# Patient Record
Sex: Male | Born: 1960
Health system: Southern US, Community
[De-identification: ages and names within clinical notes are randomized; demographics above are authoritative.]

## PROBLEM LIST (undated history)

## (undated) DIAGNOSIS — N289 Disorder of kidney and ureter, unspecified: Secondary | ICD-10-CM

## (undated) DIAGNOSIS — C801 Malignant (primary) neoplasm, unspecified: Secondary | ICD-10-CM

## (undated) DIAGNOSIS — N200 Calculus of kidney: Secondary | ICD-10-CM

---

## 2010-06-23 ENCOUNTER — Emergency Department (HOSPITAL_BASED_OUTPATIENT_CLINIC_OR_DEPARTMENT_OTHER): Admission: EM | Admit: 2010-06-23 | Discharge: 2010-06-23 | Payer: Self-pay | Admitting: Emergency Medicine

## 2017-08-22 ENCOUNTER — Emergency Department (HOSPITAL_BASED_OUTPATIENT_CLINIC_OR_DEPARTMENT_OTHER): Payer: BLUE CROSS/BLUE SHIELD

## 2017-08-22 ENCOUNTER — Encounter (HOSPITAL_BASED_OUTPATIENT_CLINIC_OR_DEPARTMENT_OTHER): Payer: Self-pay | Admitting: *Deleted

## 2017-08-22 ENCOUNTER — Emergency Department (HOSPITAL_BASED_OUTPATIENT_CLINIC_OR_DEPARTMENT_OTHER)
Admission: EM | Admit: 2017-08-22 | Discharge: 2017-08-22 | Disposition: A | Discharge status: Home / Self Care | Payer: BLUE CROSS/BLUE SHIELD | Attending: Emergency Medicine | Admitting: Emergency Medicine

## 2017-08-22 DIAGNOSIS — R1031 Right lower quadrant pain: Secondary | ICD-10-CM | POA: Diagnosis present

## 2017-08-22 DIAGNOSIS — F17210 Nicotine dependence, cigarettes, uncomplicated: Secondary | ICD-10-CM | POA: Insufficient documentation

## 2017-08-22 DIAGNOSIS — N200 Calculus of kidney: Secondary | ICD-10-CM

## 2017-08-22 HISTORY — DX: Disorder of kidney and ureter, unspecified: N28.9

## 2017-08-22 HISTORY — DX: Malignant (primary) neoplasm, unspecified: C80.1

## 2017-08-22 LAB — URINALYSIS, ROUTINE W REFLEX MICROSCOPIC
Bilirubin Urine: NEGATIVE
GLUCOSE, UA: NEGATIVE mg/dL
Ketones, ur: NEGATIVE mg/dL
Leukocytes, UA: NEGATIVE
Nitrite: NEGATIVE
PH: 5.5 (ref 5.0–8.0)
Protein, ur: 30 mg/dL — AB

## 2017-08-22 LAB — URINALYSIS, MICROSCOPIC (REFLEX): WBC, UA: NONE SEEN WBC/hpf (ref 0–5)

## 2017-08-22 MED ORDER — OXYCODONE-ACETAMINOPHEN 5-325 MG PO TABS: 2.0000 | ORAL_TABLET | ORAL | 0 refills | Status: DC | PRN

## 2017-08-22 MED ORDER — KETOROLAC TROMETHAMINE 30 MG/ML IJ SOLN
30.0000 mg | Freq: Once | INTRAMUSCULAR | Status: AC
  Administered 2017-08-22: 30 mg via INTRAVENOUS

## 2017-08-22 MED ORDER — TAMSULOSIN HCL 0.4 MG PO CAPS: 0.4000 mg | ORAL_CAPSULE | Freq: Every day | ORAL | 0 refills | Status: AC

## 2017-08-22 MED ORDER — HYDROMORPHONE HCL 1 MG/ML IJ SOLN
1.0000 mg | Freq: Once | INTRAMUSCULAR | Status: AC
  Administered 2017-08-22: 1 mg via INTRAVENOUS
  Filled 2017-08-22: qty 1

## 2017-08-22 MED ORDER — KETOROLAC TROMETHAMINE 10 MG PO TABS: 10.0000 mg | ORAL_TABLET | Freq: Four times a day (QID) | ORAL | 0 refills | Status: DC | PRN

## 2017-08-22 MED ORDER — ONDANSETRON HCL 4 MG/2ML IJ SOLN
4.0000 mg | Freq: Once | INTRAMUSCULAR | Status: AC
  Administered 2017-08-22: 4 mg via INTRAVENOUS
  Filled 2017-08-22: qty 2

## 2017-08-22 MED ORDER — SODIUM CHLORIDE 0.9 % IV SOLN
Freq: Once | INTRAVENOUS | Status: AC
  Administered 2017-08-22: 07:00:00 via INTRAVENOUS

## 2017-08-22 MED ORDER — KETOROLAC TROMETHAMINE 30 MG/ML IJ SOLN
INTRAMUSCULAR | Status: AC
  Filled 2017-08-22: qty 1

## 2017-08-22 MED FILL — KETOROLAC 10 MG TABLET: 10 | 5 days supply | Qty: 20 | Fill #0

## 2017-08-22 MED FILL — TAMSULOSIN HCL 0.4 MG CAP: 0.4 | 30 days supply | Qty: 30 | Fill #0

## 2017-08-22 MED FILL — OXYCODONE-ACETAMINOPHEN 5-3: 5-325 | 2 days supply | Qty: 15 | Fill #0

## 2017-08-22 NOTE — ED Triage Notes (Signed)
Pt with  Right flank pain since 4 am

## 2017-08-22 NOTE — Discharge Instructions (Signed)
You have been seen in the Emergency Department (ED) today for pain that we believe based on your workup, is caused by kidney stones.  As we have discussed, please drink plenty of fluids.  Please make a follow up appointment with the physician(s) listed elsewhere in this documentation. ° °You may take pain medication as needed but ONLY as prescribed.  Please also take your prescribed Flomax daily.  We also recommend that you take over-the-counter ibuprofen regularly according to label instructions over the next 5 days.  Take it with meals to minimize stomach discomfort. ° °Please see your doctor as soon as possible as stones may take 1-3 weeks to pass and you may require additional care or medications. ° °Do not drink alcohol, drive or participate in any other potentially dangerous activities while taking opiate pain medication as it may make you sleepy. Do not take this medication with any other sedating medications, either prescription or over-the-counter. If you were prescribed Percocet or Vicodin, do not take these with acetaminophen (Tylenol) as it is already contained within these medications. °  °Take Percocet as needed for severe pain.  This medication is an opiate (or narcotic) pain medication and can be habit forming.  Use it as little as possible to achieve adequate pain control.  Do not use or use it with extreme caution if you have a history of opiate abuse or dependence.  If you are on a pain contract with your primary care doctor or a pain specialist, be sure to let them know you were prescribed this medication today from the Emergency Department.  This medication is intended for your use only - do not give any to anyone else and keep it in a secure place where nobody else, especially children, have access to it.  It will also cause or worsen constipation, so you may want to consider taking an over-the-counter stool softener while you are taking this medication. ° °Return to the Emergency Department  (ED) or call your doctor if you have any worsening pain, fever, painful urination, are unable to urinate, or develop other symptoms that concern you. ° ° ° °Kidney Stones °Kidney stones (urolithiasis) are deposits that form inside your kidneys. The intense pain is caused by the stone moving through the urinary tract. When the stone moves, the ureter goes into spasm around the stone. The stone is usually passed in the urine.  °CAUSES  °A disorder that makes certain neck glands produce too much parathyroid hormone (primary hyperparathyroidism). °A buildup of uric acid crystals, similar to gout in your joints. °Narrowing (stricture) of the ureter. °A kidney obstruction present at birth (congenital obstruction). °Previous surgery on the kidney or ureters. °Numerous kidney infections. °SYMPTOMS  °Feeling sick to your stomach (nauseous). °Throwing up (vomiting). °Blood in the urine (hematuria). °Pain that usually spreads (radiates) to the groin. °Frequency or urgency of urination. °DIAGNOSIS  °Taking a history and physical exam. °Blood or urine tests. °CT scan. °Occasionally, an examination of the inside of the urinary bladder (cystoscopy) is performed. °TREATMENT  °Observation. °Increasing your fluid intake. °Extracorporeal shock wave lithotripsy--This is a noninvasive procedure that uses shock waves to break up kidney stones. °Surgery may be needed if you have severe pain or persistent obstruction. There are various surgical procedures. Most of the procedures are performed with the use of small instruments. Only small incisions are needed to accommodate these instruments, so recovery time is minimized. °The size, location, and chemical composition are all important variables that will determine the proper   choice of action for you. Talk to your health care provider to better understand your situation so that you will minimize the risk of injury to yourself and your kidney.  °HOME CARE INSTRUCTIONS  °Drink enough water  and fluids to keep your urine clear or pale yellow. This will help you to pass the stone or stone fragments. °Strain all urine through the provided strainer. Keep all particulate matter and stones for your health care provider to see. The stone causing the pain may be as small as a grain of salt. It is very important to use the strainer each and every time you pass your urine. The collection of your stone will allow your health care provider to analyze it and verify that a stone has actually passed. The stone analysis will often identify what you can do to reduce the incidence of recurrences. °Only take over-the-counter or prescription medicines for pain, discomfort, or fever as directed by your health care provider. °Keep all follow-up visits as told by your health care provider. This is important. °Get follow-up X-rays if required. The absence of pain does not always mean that the stone has passed. It may have only stopped moving. If the urine remains completely obstructed, it can cause loss of kidney function or even complete destruction of the kidney. It is your responsibility to make sure X-rays and follow-ups are completed. Ultrasounds of the kidney can show blockages and the status of the kidney. Ultrasounds are not associated with any radiation and can be performed easily in a matter of minutes. °Make changes to your daily diet as told by your health care provider. You may be told to: °Limit the amount of salt that you eat. °Eat 5 or more servings of fruits and vegetables each day. °Limit the amount of meat, poultry, fish, and eggs that you eat. °Collect a 24-hour urine sample as told by your health care provider. You may need to collect another urine sample every 6-12 months. °SEEK MEDICAL CARE IF: °You experience pain that is progressive and unresponsive to any pain medicine you have been prescribed. °SEEK IMMEDIATE MEDICAL CARE IF:  °Pain cannot be controlled with the prescribed medicine. °You have a  fever or shaking chills. °The severity or intensity of pain increases over 18 hours and is not relieved by pain medicine. °You develop a new onset of abdominal pain. °You feel faint or pass out. °You are unable to urinate. °  °This information is not intended to replace advice given to you by your health care provider. Make sure you discuss any questions you have with your health care provider. °  °Document Released: 10/08/2005 Document Revised: 06/29/2015 Document Reviewed: 03/11/2013 °Elsevier Interactive Patient Education ©2016 [employer] ° ° °

## 2017-08-22 NOTE — ED Provider Notes (Signed)
[facility] EMERGENCY DEPARTMENT Provider Note   CSN: [id number] Arrival date & time: 08/22/17  6063  History   Chief Complaint Chief Complaint  Patient presents with  . Flank Pain   HPI  Ryan Chaney is a 56 y/o male with h/o kidney stone and basal cell carcinoma on scalp (removed last year).  Patient had R flank pain which began around 4:30 AM.  Pain has been 10/10 in severity and constant in nature.  Localized to R side of abdomen and radiating to the R flank/back area.  Able to urinate and did so about an hour ago.  Did not have burning only discomfort with urination.  Has not had increased frequency or urgency.  Last episode of kidney stones was 20 years ago and he reports it was not nearly as painful.  At that time the pain was intermittent.  He felt nauseous en route to ED but has not vomited.  Currently denies nausea.  He denies fevers, chills, shortness of breath, chest pain, palpitations.  Denies hematuria.  Reports that he has had a little cold this past week which is resolving.  Does not take any medications.   Past Medical History:  Diagnosis Date  . Cancer ([place])    skin cancer   . Renal disorder    kidney stones   There are no active problems to display for this patient.  History reviewed. No pertinent surgical history.  Home Medications    Prior to Admission medications   Medication Sig Start Date End Date Taking? Authorizing Provider  ketorolac (TORADOL) 10 MG tablet Take 1 tablet (10 mg total) by mouth every 6 (six) hours as needed for moderate pain. 08/22/17   Long, Wonda Olds, MD  oxyCODONE-acetaminophen (PERCOCET/ROXICET) 5-325 MG tablet Take 2 tablets by mouth every 4 (four) hours as needed for severe pain. 08/22/17   Long, Wonda Olds, MD  tamsulosin (FLOMAX) 0.4 MG CAPS capsule Take 1 capsule (0.4 mg total) by mouth daily. 08/22/17 09/05/17  Long, Wonda Olds, MD   Family History No family history on file.  Social History Social History  Substance  Use Topics  . Smoking status: Current Every Day Smoker  . Smokeless tobacco: Never Used  . Alcohol use No   Allergies   Patient has no known allergies.  Review of Systems Review of Systems  Constitutional: Negative for chills, diaphoresis and fever.  HENT: Negative for congestion and sore throat.   Respiratory: Negative for shortness of breath and wheezing.   Cardiovascular: Negative for chest pain, palpitations and leg swelling.  Gastrointestinal: Positive for abdominal pain. Negative for nausea and vomiting.  Genitourinary: Positive for flank pain. Negative for difficulty urinating, dysuria and hematuria.   Physical Exam Updated Vital Signs BP (!) 169/85 (BP Location: Left Arm)   Pulse 89   Temp 98.1 F (36.7 C) (Oral)   Resp 18   Ht 6' (1.829 m)   Wt 99.8 kg (220 lb)   SpO2 100%   BMI 29.84 kg/m   Physical Exam Gen- 56 yo male, in moderate distress, wife at bedside  Skin - no rashes noted  HEENT - EOMI, PERRL, MMM, o/p clear  Neck - supple, no JVD Chest - clear to auscultation bilaterally, no wheeze  Heart - RRR no MRG  Abdomen - soft, TTP over RUQ, no rebound tenderness  Musculoskeletal - no edema or tenderness Neuro - AOx3, no focal deficits, speech is normal   ED Treatments / Results  Labs (all labs ordered  are listed, but only abnormal results are displayed) Labs Reviewed  URINALYSIS, ROUTINE W REFLEX MICROSCOPIC - Abnormal; Notable for the following:       Result Value   Specific Gravity, Urine >1.030 (*)    Hgb urine dipstick LARGE (*)    Protein, ur 30 (*)    All other components within normal limits  URINALYSIS, MICROSCOPIC (REFLEX) - Abnormal; Notable for the following:    Bacteria, UA RARE (*)    Squamous Epithelial / LPF 0-5 (*)    All other components within normal limits   EKG  EKG Interpretation None      Radiology Ct Renal Stone Study  Result Date: 08/22/2017 CLINICAL DATA:  Severe right flank pain for 3 hours. EXAM: CT ABDOMEN AND  PELVIS WITHOUT CONTRAST TECHNIQUE: Multidetector CT imaging of the abdomen and pelvis was performed following the standard protocol without IV contrast. COMPARISON:  None. FINDINGS: Lower chest: The lung bases are clear of acute process. No pleural effusion or pulmonary lesions. The heart is normal in size. No pericardial effusion. Scattered coronary artery calcifications are noted. The distal esophagus and aorta are unremarkable. Hepatobiliary: A few tiny scattered low-attenuation lesions are likely benign hepatic cysts. No worrisome hepatic findings or intrahepatic biliary dilatation. The gallbladder appears normal. No common bile duct dilatation. Pancreas: No mass, inflammation or ductal dilatation. Spleen: Normal size.  No focal lesions. Adrenals/Urinary Tract: The adrenal glands are normal except for a benign fatty lesion involving the lateral limb of the left adrenal gland consistent with a adrenal gland myelolipoma. Midpole right renal calculi are noted. There is also moderate right-sided hydroureteronephrosis down to an obstructing The left kidney is normal. No renal, ureteral or bladder calculi. No worrisome renal or bladder lesions without contrast. Stomach/Bowel: The stomach, duodenum, small bowel and colon are unremarkable. No acute inflammatory changes, mass lesions or obstructive findings. The terminal ileum is normal. The appendix is normal. Mild to moderate sigmoid diverticulosis without findings for acute diverticulitis. Vascular/Lymphatic: The age advanced atherosclerotic calcifications involving the aorta and iliac arteries. No aneurysm. No mesenteric or retroperitoneal mass or lymphadenopathy. Small scattered lymph nodes are noted. Reproductive: The prostate gland and seminal vesicles are unremarkable. Other: Prominent inguinal rings containing fat. No inguinal mass or adenopathy. No free pelvic fluid collections. Small periumbilical abdominal wall hernia containing fat. Musculoskeletal: No  significant bony findings. Moderate degenerative changes involving the lumbar spine and hips. IMPRESSION: 1. 2 mm right UVJ calculus causing moderate right-sided hydroureteronephrosis. 2. Tube midpole right renal calculi. 3. 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 4. A few scattered low-attenuation liver lesions are most likely benign cysts. 5. Age advanced atherosclerotic calcifications involving the aorta, iliac arteries and coronary arteries. Electronically Signed   By: Marijo Sanes M.D.   On: 08/22/2017 07:14   Procedures Procedures (including critical care time)  Medications Ordered in ED Medications  0.9 %  sodium chloride infusion ( Intravenous Stopped 08/22/17 0815)  ondansetron (ZOFRAN) injection 4 mg (4 mg Intravenous Given 08/22/17 0643)  HYDROmorphone (DILAUDID) injection 1 mg (1 mg Intravenous Given 08/22/17 0643)  ketorolac (TORADOL) 30 MG/ML injection 30 mg (30 mg Intravenous Given 08/22/17 0734)   Initial Impression / Assessment and Plan / ED Course  I have reviewed the triage vital signs and the nursing notes.  Pertinent labs & imaging results that were available during my care of the patient were reviewed by me and considered in my medical decision making (see chart for details).  56 yo male with distant  hx of kidney stones 20 years ago presenting with R flank pain beginning this morning. UA with large hgb but otherwise noninfectious. IV Dilaudid x1 given for pain control and Zofran for nausea.  CT revealed 2 mm calculus in right UVJ causing moderate-sized hydroureteronephrosis. Anticipate stone is small enough to pass on it's own.  -Gentle hydration with NS @ 125 cc.hr.   -IV Toradol for ongoing discomfort  -Plan for follow up with Urology for further eval once pain well controlled -Discharge home with Rx for Flomax, Toradol and percocet.      Final Clinical Impressions(s) / ED Diagnoses   Final diagnoses:  Kidney stone   New Prescriptions Discharge Medication List  as of 08/22/2017  8:11 AM    START taking these medications   Details  ketorolac (TORADOL) 10 MG tablet Take 1 tablet (10 mg total) by mouth every 6 (six) hours as needed for moderate pain., Starting Thu 08/22/2017, Print    oxyCODONE-acetaminophen (PERCOCET/ROXICET) 5-325 MG tablet Take 2 tablets by mouth every 4 (four) hours as needed for severe pain., Starting Thu 08/22/2017, Print    tamsulosin (FLOMAX) 0.4 MG CAPS capsule Take 1 capsule (0.4 mg total) by mouth daily., Starting Thu 08/22/2017, Until Thu 09/05/2017, Print        Lovenia Kim, MD  [place], PGY-2      Lovenia Kim, MD 08/22/17 [id number]    Margette Fast, MD 08/22/17 [phone]

## 2018-03-18 IMAGING — CT CT RENAL STONE PROTOCOL
2 of 4 series · 16 of 46 positions shown, 18 images · non-contrast
Comparison: None.

CLINICAL DATA: Severe right flank pain for 3 hours.

EXAM:
CT ABDOMEN AND PELVIS WITHOUT CONTRAST
TECHNIQUE: Multidetector CT imaging of the abdomen and pelvis was performed
following the standard protocol without IV contrast.

[Series 2: axial st · axial · 0.88mm/px · z∈[-514,-69]mm · 13 of 99 slices shown, 15 images]
[im 5/99  soft-tissue]
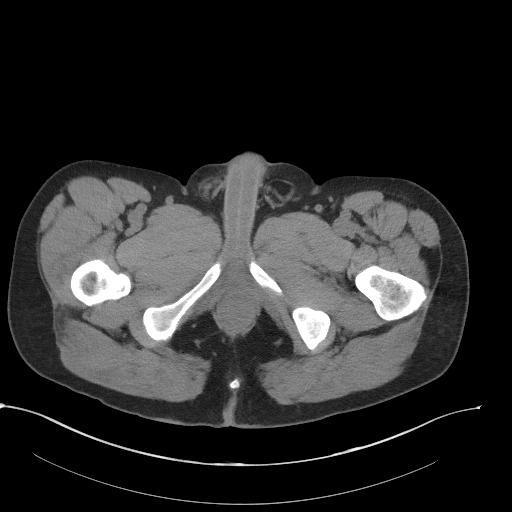
[im 5/99  bone]
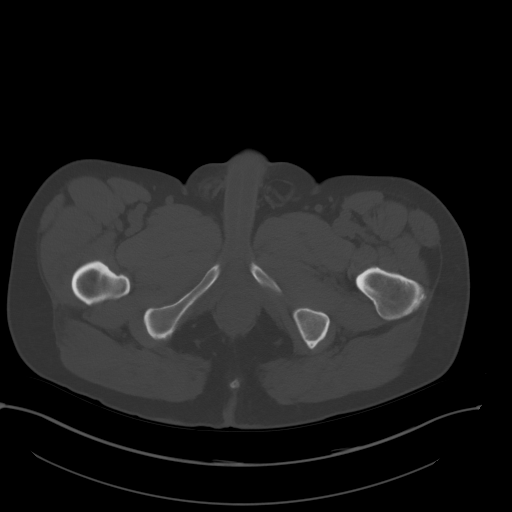
[im 13/99  soft-tissue]
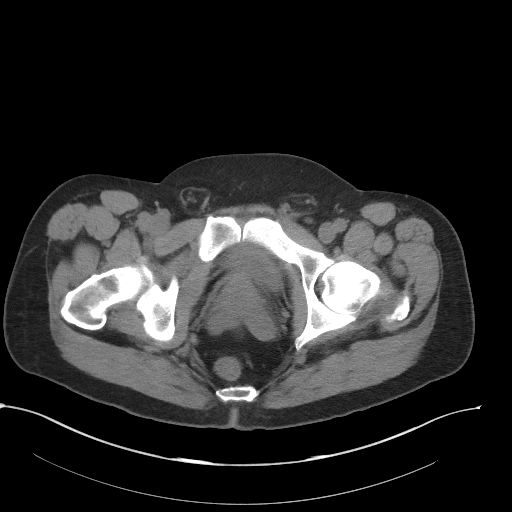
[im 22/99  soft-tissue]
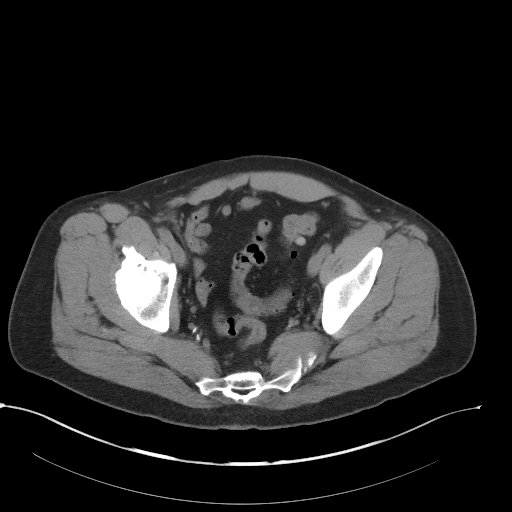
[im 26/99  soft-tissue]
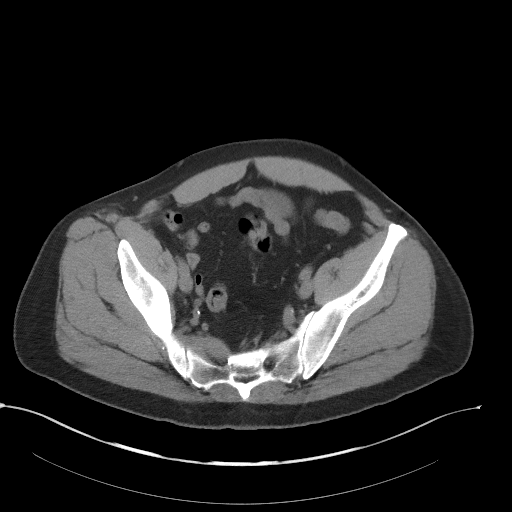
[im 35/99  soft-tissue]
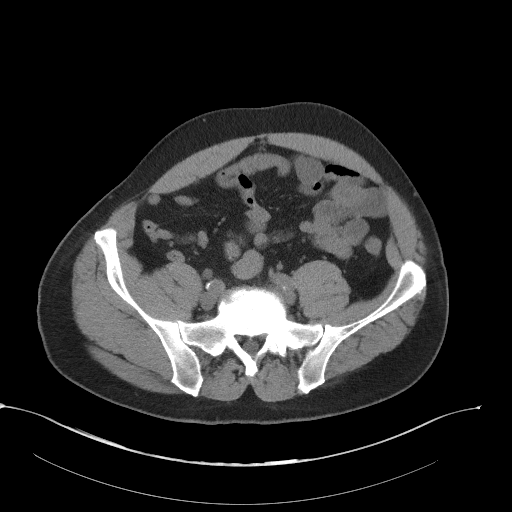
[im 43/99  soft-tissue]
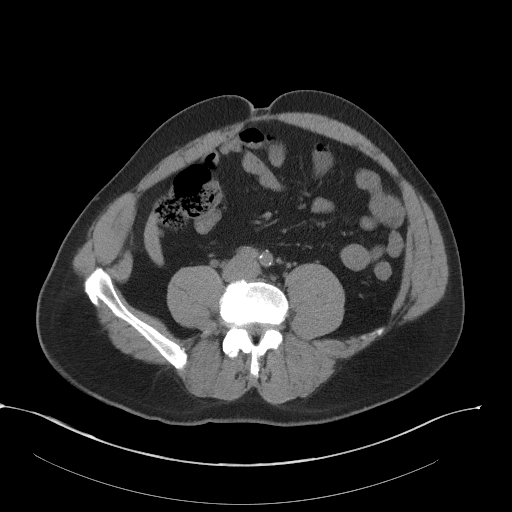
[im 52/99  soft-tissue]
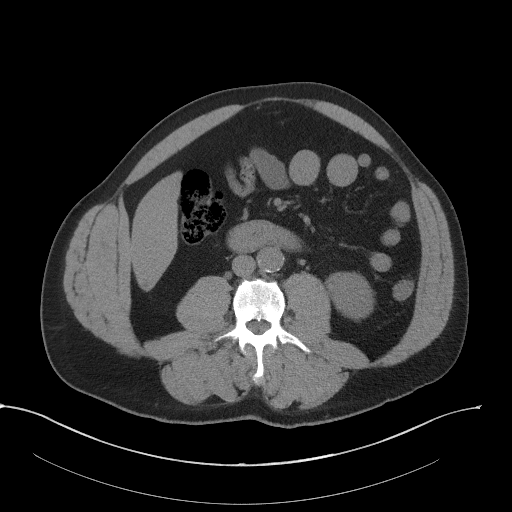
[im 56/99  soft-tissue]
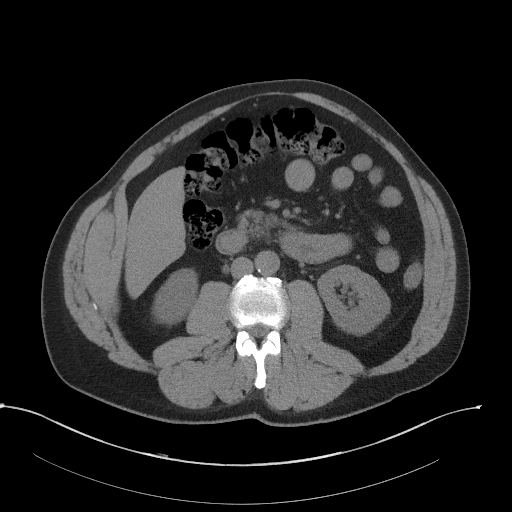
[im 64/99  soft-tissue]
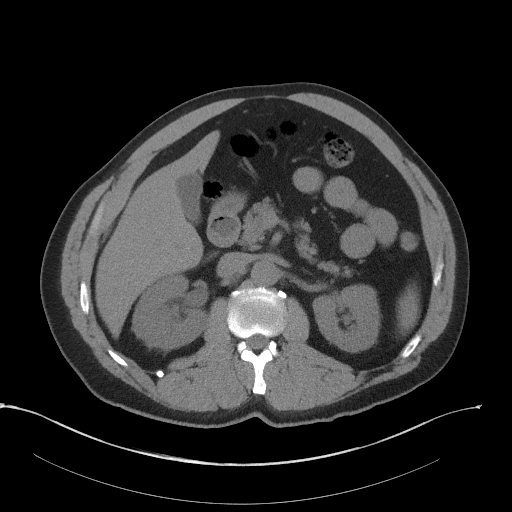
[im 64/99  bone]
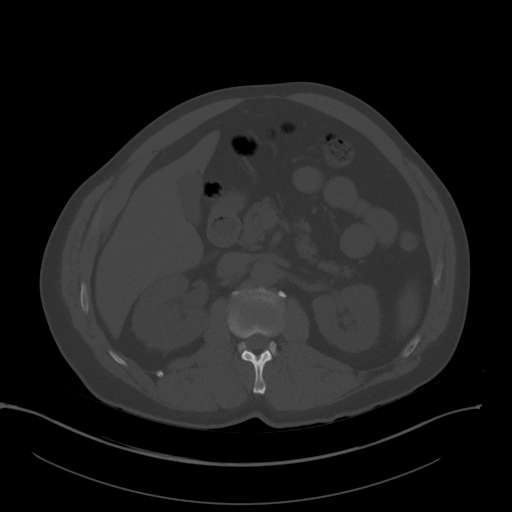
[im 73/99  soft-tissue]
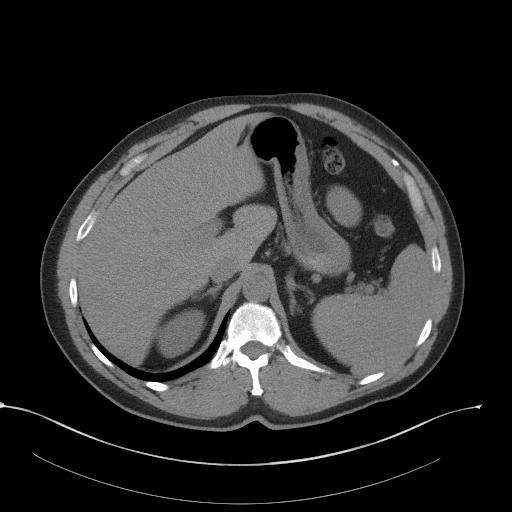
[im 77/99  soft-tissue]
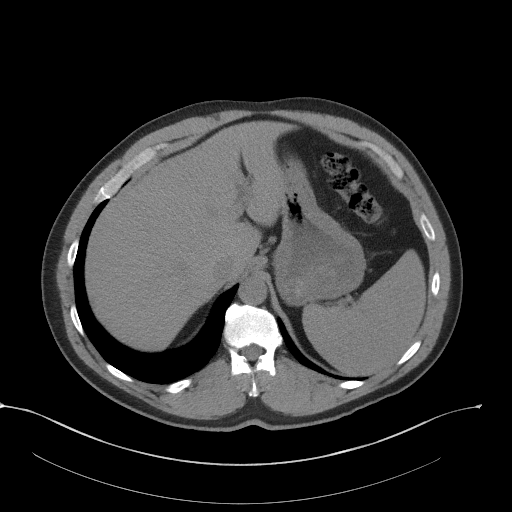
[im 86/99  soft-tissue]
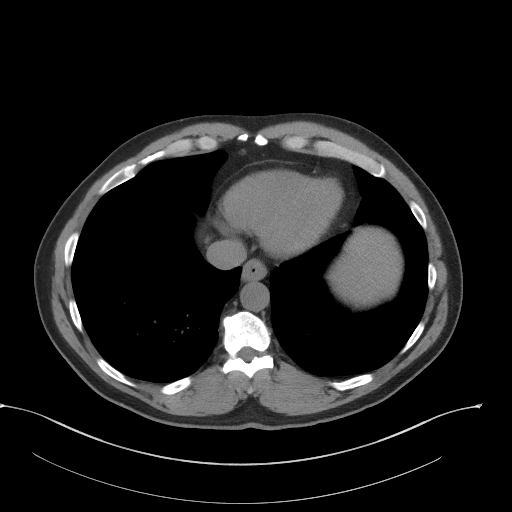
[im 94/99  soft-tissue]
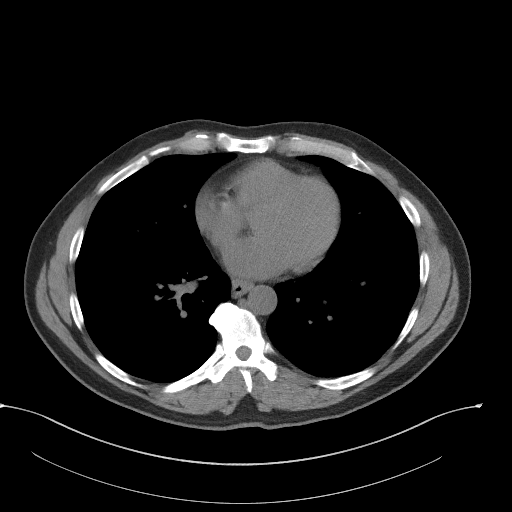

[Series 4: coronal st · coronal · 0.89mm/px · 3 of 99 slices shown]
[im 33/99  soft-tissue]
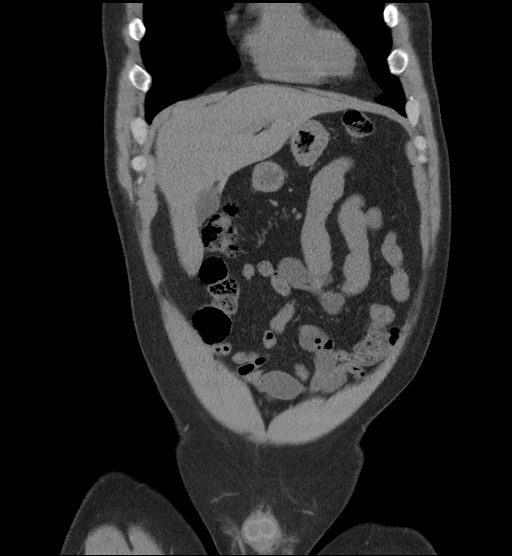
[im 44/99  soft-tissue]
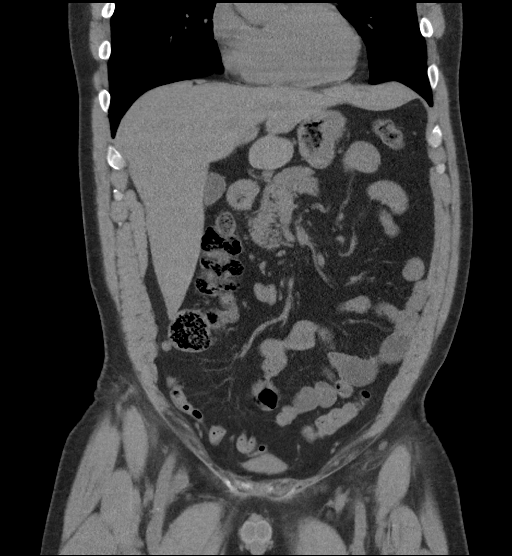
[im 55/99  soft-tissue]
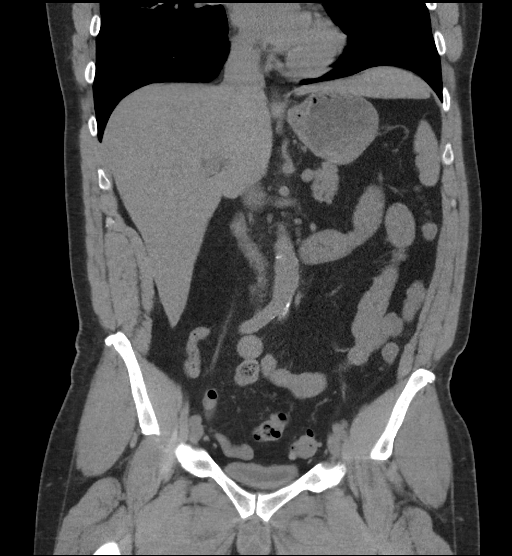

[16 of 46 positions shown; findings below may reference images not displayed]

FINDINGS: Lower chest: The lung bases are clear of acute process. No pleural
effusion or pulmonary lesions. The heart is normal in size. No
pericardial effusion. Scattered coronary artery calcifications are
noted. The distal esophagus and aorta are unremarkable.

Hepatobiliary: A few tiny scattered low-attenuation lesions are
likely benign hepatic cysts. No worrisome hepatic findings or
intrahepatic biliary dilatation. The gallbladder appears normal. No
common bile duct dilatation.

Pancreas: No mass, inflammation or ductal dilatation.

Spleen: Normal size.  No focal lesions.

Adrenals/Urinary Tract: The adrenal glands are normal except for a
benign fatty lesion involving the lateral limb of the left adrenal
gland consistent with a adrenal gland myelolipoma.

Midpole right renal calculi are noted. There is also moderate
right-sided hydroureteronephrosis down to an obstructing

The left kidney is normal. No renal, ureteral or bladder calculi. No
worrisome renal or bladder lesions without contrast.

Stomach/Bowel: The stomach, duodenum, small bowel and colon are
unremarkable. No acute inflammatory changes, mass lesions or
obstructive findings. The terminal ileum is normal. The appendix is
normal. Mild to moderate sigmoid diverticulosis without findings for
acute diverticulitis.

Vascular/Lymphatic: The age advanced atherosclerotic calcifications
involving the aorta and iliac arteries. No aneurysm. No mesenteric
or retroperitoneal mass or lymphadenopathy. Small scattered lymph
nodes are noted.

Reproductive: The prostate gland and seminal vesicles are
unremarkable.

Other: Prominent inguinal rings containing fat. No inguinal mass or
adenopathy. No free pelvic fluid collections. Small periumbilical
abdominal wall hernia containing fat.

Musculoskeletal: No significant bony findings. Moderate degenerative
changes involving the lumbar spine and hips.
IMPRESSION: 1. 2 mm right UVJ calculus causing moderate right-sided
hydroureteronephrosis.
2. Tube midpole right renal calculi.
3. Benign 2.3 cm left adrenal gland myelolipoma.
4. A few scattered low-attenuation liver lesions are most likely
benign cysts.
5. Age advanced atherosclerotic calcifications involving the aorta,
iliac arteries and coronary arteries.

## 2018-05-29 ENCOUNTER — Other Ambulatory Visit: Payer: Self-pay

## 2018-05-29 ENCOUNTER — Encounter (HOSPITAL_BASED_OUTPATIENT_CLINIC_OR_DEPARTMENT_OTHER): Payer: Self-pay

## 2018-05-29 ENCOUNTER — Emergency Department (HOSPITAL_BASED_OUTPATIENT_CLINIC_OR_DEPARTMENT_OTHER)
Admission: EM | Admit: 2018-05-29 | Discharge: 2018-05-30 | Disposition: A | Discharge status: Home / Self Care | Payer: BLUE CROSS/BLUE SHIELD | Attending: Emergency Medicine | Admitting: Emergency Medicine

## 2018-05-29 ENCOUNTER — Emergency Department (HOSPITAL_BASED_OUTPATIENT_CLINIC_OR_DEPARTMENT_OTHER): Payer: BLUE CROSS/BLUE SHIELD

## 2018-05-29 DIAGNOSIS — N50811 Right testicular pain: Secondary | ICD-10-CM | POA: Diagnosis not present

## 2018-05-29 DIAGNOSIS — F17200 Nicotine dependence, unspecified, uncomplicated: Secondary | ICD-10-CM | POA: Insufficient documentation

## 2018-05-29 DIAGNOSIS — Z85828 Personal history of other malignant neoplasm of skin: Secondary | ICD-10-CM | POA: Diagnosis not present

## 2018-05-29 DIAGNOSIS — Z87442 Personal history of urinary calculi: Secondary | ICD-10-CM | POA: Diagnosis not present

## 2018-05-29 DIAGNOSIS — N23 Unspecified renal colic: Secondary | ICD-10-CM

## 2018-05-29 DIAGNOSIS — R109 Unspecified abdominal pain: Secondary | ICD-10-CM | POA: Diagnosis present

## 2018-05-29 HISTORY — DX: Calculus of kidney: N20.0

## 2018-05-29 MED ORDER — ONDANSETRON HCL 4 MG/2ML IJ SOLN
4.0000 mg | Freq: Once | INTRAMUSCULAR | Status: AC
  Administered 2018-05-29: 4 mg via INTRAVENOUS
  Filled 2018-05-29: qty 2

## 2018-05-29 MED ORDER — HYDROMORPHONE HCL 1 MG/ML IJ SOLN
1.0000 mg | Freq: Once | INTRAMUSCULAR | Status: AC
  Administered 2018-05-29: 1 mg via INTRAVENOUS
  Filled 2018-05-29: qty 1

## 2018-05-29 NOTE — ED Triage Notes (Signed)
Pt reports 10/10 right flank pain w/ radiation to testicles. Pt reports hx of kidney stones. Pt A+OX4, NAD.

## 2018-05-29 NOTE — ED Provider Notes (Signed)
[facility] EMERGENCY DEPARTMENT Provider Note   CSN: [id number] Arrival date & time: 05/29/18  2335     History   Chief Complaint Chief Complaint  Patient presents with  . Flank Pain    Right  . Testicle Pain    HPI Ryan Chaney is a 57 y.o. male.  Patient presents to the emergency department for evaluation of flank pain, testicular pain.  Patient reports that 4 days ago he started having pain in the area of his penis.  There was some right sided abdominal discomfort at that time as well.  Today, however, he had sudden onset of severe right flank pain and right testicular pain.  He reports that he has a history of kidney stones.  The flank pain feels exactly like previous kidney stones, however, he has never had testicular pain with his kidney stones.  He reports that his testicular pain feels like "blue balls".     Past Medical History:  Diagnosis Date  . Cancer ([place])    skin cancer   . Kidney stone   . Renal disorder    kidney stones    There are no active problems to display for this patient.   History reviewed. No pertinent surgical history.      Home Medications    Prior to Admission medications   Medication Sig Start Date End Date Taking? Authorizing Provider  oxyCODONE-acetaminophen (PERCOCET) 5-325 MG tablet Take 1-2 tablets by mouth every 4 (four) hours as needed. 05/30/18   Orpah Greek, MD  tamsulosin (FLOMAX) 0.4 MG CAPS capsule Take 1 capsule (0.4 mg total) by mouth daily. 05/30/18   Orpah Greek, MD    Family History History reviewed. No pertinent family history.  Social History Social History   Tobacco Use  . Smoking status: Current Every Day Smoker  . Smokeless tobacco: Never Used  Substance Use Topics  . Alcohol use: No  . Drug use: No     Allergies   Patient has no known allergies.   Review of Systems Review of Systems  Genitourinary: Positive for flank pain, penile pain and testicular pain.    All other systems reviewed and are negative.    Physical Exam Updated Vital Signs BP (!) 138/94 (BP Location: Right Arm)   Pulse 94   Temp (!) 97.5 F (36.4 C) (Oral)   Resp 16   SpO2 97%   Physical Exam  Constitutional: He is oriented to person, place, and time. He appears well-developed and well-nourished. He appears distressed.  HENT:  Head: Normocephalic and atraumatic.  Right Ear: Hearing normal.  Left Ear: Hearing normal.  Nose: Nose normal.  Mouth/Throat: Oropharynx is clear and moist and mucous membranes are normal.  Eyes: Pupils are equal, round, and reactive to light. Conjunctivae and EOM are normal.  Neck: Normal range of motion. Neck supple.  Cardiovascular: Regular rhythm, S1 normal and S2 normal. Exam reveals no gallop and no friction rub.  No murmur heard. Pulmonary/Chest: Effort normal and breath sounds normal. No respiratory distress. He exhibits no tenderness.  Abdominal: Soft. Normal appearance and bowel sounds are normal. There is no hepatosplenomegaly. There is no tenderness. There is no rebound, no guarding, no tenderness at McBurney's point and negative Murphy's sign. No hernia.  Genitourinary: Penis normal. Right testis shows no mass and no swelling. Left testis shows no mass and no swelling.  Musculoskeletal: Normal range of motion.  Neurological: He is alert and oriented to person, place, and time. He has normal  strength. No cranial nerve deficit or sensory deficit. Coordination normal. GCS eye subscore is 4. GCS verbal subscore is 5. GCS motor subscore is 6.  Skin: Skin is warm, dry and intact. No rash noted. No cyanosis.  Psychiatric: He has a normal mood and affect. His speech is normal and behavior is normal. Thought content normal.  Nursing note and vitals reviewed.    ED Treatments / Results  Labs (all labs ordered are listed, but only abnormal results are displayed) Labs Reviewed  CBC WITH DIFFERENTIAL/PLATELET - Abnormal; Notable for the  following components:      Result Value   Platelets 134 (*)    All other components within normal limits  BASIC METABOLIC PANEL - Abnormal; Notable for the following components:   Glucose, Bld 114 (*)    All other components within normal limits  URINALYSIS, ROUTINE W REFLEX MICROSCOPIC - Abnormal; Notable for the following components:   APPearance CLOUDY (*)    Specific Gravity, Urine >1.030 (*)    Hgb urine dipstick LARGE (*)    Protein, ur 100 (*)    All other components within normal limits  URINALYSIS, MICROSCOPIC (REFLEX) - Abnormal; Notable for the following components:   Bacteria, UA RARE (*)    All other components within normal limits    EKG None  Radiology Ct Renal Stone Study  Result Date: 05/30/2018 CLINICAL DATA:  Flank pain radiating to the testicles. History of kidney stones. EXAM: CT ABDOMEN AND PELVIS WITHOUT CONTRAST TECHNIQUE: Multidetector CT imaging of the abdomen and pelvis was performed following the standard protocol without IV contrast. COMPARISON:  08/22/2017 CT abdomen pelvis FINDINGS: LOWER CHEST: No basilar pulmonary nodules or pleural effusion. No apical pericardial effusion. HEPATOBILIARY: Normal hepatic contours and density. No intra- or extrahepatic biliary dilatation. Normal gallbladder. PANCREAS: Normal parenchymal contours without ductal dilatation. No peripancreatic fluid collection. SPLEEN: Normal. ADRENALS/URINARY TRACT: --Adrenal glands: Normal. --Right kidney/ureter: Mild hydroureteronephrosis secondary to a 4 mm stone at the right ureterovesical junction. Minimal perinephric stranding. There are multiple nonobstructing renal calculi measuring up to 3 mm. --Left kidney/ureter: No hydronephrosis, nephroureterolithiasis, perinephric stranding or solid renal mass. --Urinary bladder: Normal for degree of distention STOMACH/BOWEL: --Stomach/Duodenum: No hiatal hernia or other gastric abnormality. Normal duodenal course. --Small bowel: No dilatation or  inflammation. --Colon: No focal abnormality. --Appendix: Normal. VASCULAR/LYMPHATIC: Atherosclerotic calcification is present within the non-aneurysmal abdominal aorta, without hemodynamically significant stenosis. No abdominal or pelvic lymphadenopathy. REPRODUCTIVE: Prostate calcification. MUSCULOSKELETAL. No bony spinal canal stenosis or focal osseous abnormality. OTHER: None. IMPRESSION: 1. Right obstructive uropathy secondary to 4 mm stone at the right ureterovesical junction, causing mild hydroureteronephrosis. 2. Multiple nonobstructing right renal calculi measuring up to 3 mm. 3.  Aortic Atherosclerosis (ICD10-I70.0). Electronically Signed   By: Ulyses Jarred M.D.   On: 05/30/2018 00:51    Procedures Procedures (including critical care time)  Medications Ordered in ED Medications  HYDROmorphone (DILAUDID) injection 1 mg (1 mg Intravenous Given 05/29/18 2352)  ondansetron (ZOFRAN) injection 4 mg (4 mg Intravenous Given 05/29/18 2351)  ketorolac (TORADOL) 30 MG/ML injection 30 mg (30 mg Intravenous Given 05/30/18 0038)     Initial Impression / Assessment and Plan / ED Course  I have reviewed the triage vital signs and the nursing notes.  Pertinent labs & imaging results that were available during my care of the patient were reviewed by me and considered in my medical decision making (see chart for details).     She presents to the ER for evaluation of  right flank pain, right testicular pain, penile pain.  Symptoms ongoing for 4 days.  He does have a history of kidney stones.  Urinalysis shows large blood, no obvious infection.  Patient has had resolution of pain with a dose of Dilaudid and Toradol.  CT renal stone study confirms 4 mm stone at the UVJ.  Patient will be discharged with analgesia, Flomax, follow-up with urology.  Final Clinical Impressions(s) / ED Diagnoses   Final diagnoses:  Ureteral colic    ED Discharge Orders         Ordered    oxyCODONE-acetaminophen (PERCOCET)  5-325 MG tablet  Every 4 hours PRN     05/30/18 0112    tamsulosin (FLOMAX) 0.4 MG CAPS capsule  Daily     05/30/18 0112           Orpah Greek, MD 05/30/18 [phone]

## 2018-05-30 LAB — URINALYSIS, ROUTINE W REFLEX MICROSCOPIC
BILIRUBIN URINE: NEGATIVE
Glucose, UA: NEGATIVE mg/dL
KETONES UR: NEGATIVE mg/dL
Leukocytes, UA: NEGATIVE
NITRITE: NEGATIVE
PROTEIN: 100 mg/dL — AB
pH: 5.5 (ref 5.0–8.0)

## 2018-05-30 LAB — BASIC METABOLIC PANEL
Anion gap: 11 (ref 5–15)
BUN: 18 mg/dL (ref 6–20)
CALCIUM: 9.2 mg/dL (ref 8.9–10.3)
CHLORIDE: 102 mmol/L (ref 98–111)
CO2: 24 mmol/L (ref 22–32)
CREATININE: 0.96 mg/dL (ref 0.61–1.24)
GFR calc non Af Amer: >60 mL/min (ref >60)
Glucose, Bld: 114 mg/dL — ABNORMAL HIGH (ref 70–99)
Potassium: 3.7 mmol/L (ref 3.5–5.1)
Sodium: 137 mmol/L (ref 135–145)

## 2018-05-30 LAB — URINALYSIS, MICROSCOPIC (REFLEX)

## 2018-05-30 LAB — CBC WITH DIFFERENTIAL/PLATELET
BASOS ABS: 0 10*3/uL (ref 0.0–0.1)
Basophils Relative: 0 %
EOS ABS: 0.2 10*3/uL (ref 0.0–0.7)
Eosinophils Relative: 3 %
HEMATOCRIT: 39.9 % (ref 39.0–52.0)
HEMOGLOBIN: 14.3 g/dL (ref 13.0–17.0)
Lymphocytes Relative: 25 %
Lymphs Abs: 2 10*3/uL (ref 0.7–4.0)
MCH: 32.3 pg (ref 26.0–34.0)
MCHC: 35.8 g/dL (ref 30.0–36.0)
MCV: 90.1 fL (ref 78.0–100.0)
Monocytes Absolute: 0.6 10*3/uL (ref 0.1–1.0)
Monocytes Relative: 8 %
NEUTROS ABS: 5 10*3/uL (ref 1.7–7.7)
NEUTROS PCT: 64 %
Platelets: 134 10*3/uL — ABNORMAL LOW (ref 150–400)
RBC: 4.43 MIL/uL (ref 4.22–5.81)
RDW: 12.8 % (ref 11.5–15.5)
WBC: 7.9 10*3/uL (ref 4.0–10.5)

## 2018-05-30 MED ORDER — TAMSULOSIN HCL 0.4 MG PO CAPS: 0.4000 mg | ORAL_CAPSULE | Freq: Every day | ORAL | 0 refills | Status: AC

## 2018-05-30 MED ORDER — KETOROLAC TROMETHAMINE 30 MG/ML IJ SOLN
30.0000 mg | Freq: Once | INTRAMUSCULAR | Status: AC
  Administered 2018-05-30: 30 mg via INTRAVENOUS
  Filled 2018-05-30: qty 1

## 2018-05-30 MED ORDER — OXYCODONE-ACETAMINOPHEN 5-325 MG PO TABS: 1.0000 | ORAL_TABLET | ORAL | 0 refills | Status: AC | PRN

## 2018-11-16 ENCOUNTER — Emergency Department (HOSPITAL_BASED_OUTPATIENT_CLINIC_OR_DEPARTMENT_OTHER)
Admission: EM | Admit: 2018-11-16 | Discharge: 2018-11-22 | Disposition: E | Payer: BLUE CROSS/BLUE SHIELD | Attending: Emergency Medicine | Admitting: Emergency Medicine

## 2018-11-16 DIAGNOSIS — I469 Cardiac arrest, cause unspecified: Secondary | ICD-10-CM | POA: Diagnosis not present

## 2018-11-16 DIAGNOSIS — F17200 Nicotine dependence, unspecified, uncomplicated: Secondary | ICD-10-CM | POA: Diagnosis not present

## 2018-11-16 DIAGNOSIS — R4020 Unspecified coma: Secondary | ICD-10-CM | POA: Diagnosis present

## 2018-11-16 DIAGNOSIS — Z79899 Other long term (current) drug therapy: Secondary | ICD-10-CM | POA: Insufficient documentation

## 2018-11-16 DIAGNOSIS — Z85828 Personal history of other malignant neoplasm of skin: Secondary | ICD-10-CM | POA: Insufficient documentation

## 2018-11-16 MED ORDER — EPINEPHRINE PF 1 MG/10ML IJ SOSY
PREFILLED_SYRINGE | INTRAMUSCULAR | Status: AC | PRN
  Administered 2018-11-16 (×3): 1 mg via INTRAVENOUS

## 2018-11-16 MED ORDER — ATROPINE SULFATE 1 MG/ML IJ SOLN
INTRAMUSCULAR | Status: AC | PRN
  Administered 2018-11-16: 1 mg via INTRAVENOUS

## 2018-11-16 MED ORDER — SODIUM BICARBONATE 8.4 % IV SOLN
INTRAVENOUS | Status: AC | PRN
  Administered 2018-11-16: 50 meq via INTRAVENOUS

## 2018-11-16 MED ORDER — SODIUM CHLORIDE 0.9 % IV SOLN
INTRAVENOUS | Status: AC | PRN
  Administered 2018-11-16: 1000 mL via INTRAVENOUS

## 2018-11-22 NOTE — Code Documentation (Addendum)
Patient time of death occurred at 53. Bedside ultrasound completed by Dr. Florina Ou at bedside with no cardiac activity.

## 2018-11-22 NOTE — ED Notes (Signed)
Spouse called registration for help getting pt out of vehicle. RN directly to parking lot to assess/assist. Pt unresponsive without pulse or respirations on RN arrival to parking lot.

## 2018-11-22 NOTE — ED Notes (Addendum)
Spoke with transport service that was provided by Izora Ribas, medical examiner. They will be here to transport pt to [facility] in approx 35 minutes. Pt's wife is aware that pt will be transported over to [facility] to the morgue. Also spoke with Stanton Kidney from bed placement and she is aware that pt will be arriving to the morgue.

## 2018-11-22 NOTE — ED Notes (Signed)
Transport service here and  transported pt to the [facility].

## 2018-11-22 NOTE — ED Notes (Signed)
Eye prep by Starleen Blue, RN

## 2018-11-22 NOTE — Code Documentation (Addendum)
Dropped off  By wife at front of ED, pt pulseless. Pulled to ground and CPR started by ED team. Wife reports no hx, cp x1 hour.

## 2018-11-22 NOTE — ED Provider Notes (Addendum)
[facility] DEPT [facility] Provider Note: Georgena Spurling, MD, FACEP  CSN: [id number] MRN: [id number] ARRIVAL: November 17, 2018 at [place]: [place]  Cardiac Arrest  Level 5 caveat: Cardiac arrest HISTORY OF PRESENT ILLNESS  2018-11-17 seen on arrival to resuscitation room Ryan Chaney is a 58 y.o. male without significant past medical history.  He was brought here by private vehicle after having chest pain for about an hour.  On arrival he was unresponsive.  Nursing staff who extracted him from the vehicle noted him to be pulseless and apneic, unresponsive with nonreactive pupils.    Past Medical History:  Diagnosis Date  . Cancer ([place])    skin cancer   . Kidney stone   . Renal disorder    kidney stones    No past surgical history on file.  No family history on file.  Social History   Tobacco Use  . Smoking status: Current Every Day Smoker  . Smokeless tobacco: Never Used  Substance Use Topics  . Alcohol use: No  . Drug use: No    Prior to Admission medications   Medication Sig Start Date End Date Taking? Authorizing Provider  oxyCODONE-acetaminophen (PERCOCET) 5-325 MG tablet Take 1-2 tablets by mouth every 4 (four) hours as needed. 05/30/18   Orpah Greek, MD  tamsulosin (FLOMAX) 0.4 MG CAPS capsule Take 1 capsule (0.4 mg total) by mouth daily. 05/30/18   Orpah Greek, MD    Allergies Patient has no known allergies.   REVIEW OF SYSTEMS     PHYSICAL EXAMINATION  Initial Vital Signs There were no vitals taken for this visit.  Examination General: Well-developed, well-nourished male in no acute distress; appearance consistent with age of record HENT: normocephalic; atraumatic Eyes: Pupils fixed, dilated Neck: supple Heart: Pulseless Lungs: Apneic Abdomen: soft; nondistended; bowel sounds absent Extremities: No deformity; pulses absent Neurologic: Unresponsive  Skin: Pale; mottled   RESULTS  Summary of this visit's  results, reviewed by myself:   EKG Interpretation  Date/Time:    Ventricular Rate:    PR Interval:    QRS Duration:   QT Interval:    QTC Calculation:   R Axis:     Text Interpretation:        Laboratory Studies: No results found for this or any previous visit (from the past 24 hour(s)). Imaging Studies: No results found.  ED COURSE and MDM  Nursing notes and initial vitals signs, including pulse oximetry, reviewed.  There were no vitals filed for this visit. (Pulseless and apneic.)  3:28 AM Medical examiner contacted.  He requests the body be transported to the [place].  PROCEDURES  Procedures  The patient was brought to the resuscitation room with CPR in progress.  He was initially bag-valve-mask ventilated.  I then intubated the patient with a 7.5 cuffed endotracheal tube using a Glidescope to visualize the cords.  No medications were used.  After intubation there were equal breath sounds bilaterally with positive color change on the carbon dioxide detector.  CPR continued and he was given a total of 3 A of epinephrine, 1 amp of atropine and 1 amp of sodium bicarbonate.  He was also defibrillated for fine ventricular fibrillation 3 times without return to spontaneous circulation.  He was pronounced dead at 3:01 AM with no cardiac wall motion activity seen on bedside ultrasound and no organized electrical activity on the monitor.  ED DIAGNOSES     ICD-10-CM   1. Cardiac arrest ([place]) I46.9  Madelein Mahadeo, Jenny Reichmann, MD 12-14-2018 [id number]    Shanon Rosser, MD 2018/12/14 [id number]

## 2018-11-22 NOTE — Code Documentation (Signed)
Intubated

## 2018-11-22 NOTE — Code Documentation (Signed)
Asystole at pulse check

## 2018-11-22 DEATH — deceased

## 2018-12-24 IMAGING — CT CT RENAL STONE PROTOCOL
2 of 4 series · 15 of 46 positions shown, 17 images · non-contrast
Comparison: 08/22/2017 CT abdomen pelvis

CLINICAL DATA: Flank pain radiating to the testicles. History of
kidney stones.

EXAM:
CT ABDOMEN AND PELVIS WITHOUT CONTRAST
TECHNIQUE: Multidetector CT imaging of the abdomen and pelvis was performed
following the standard protocol without IV contrast.

[Series 2: axial st · axial · 0.86mm/px · z∈[+406,+926]mm · 12 of 114 slices shown, 14 images]
[im 5/114  soft-tissue]
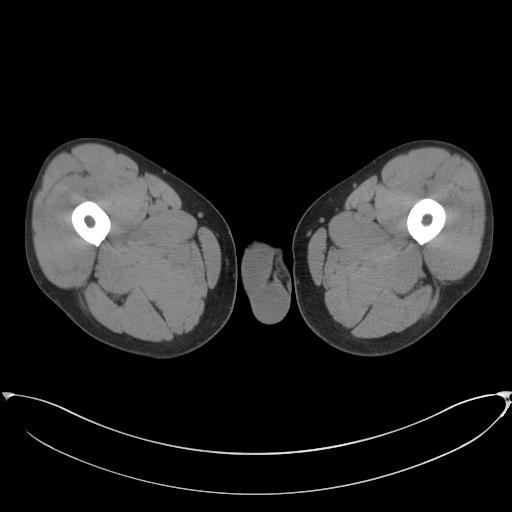
[im 5/114  bone]
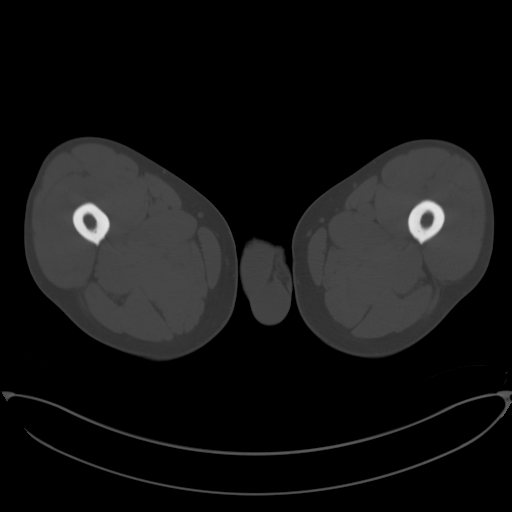
[im 15/114  soft-tissue]
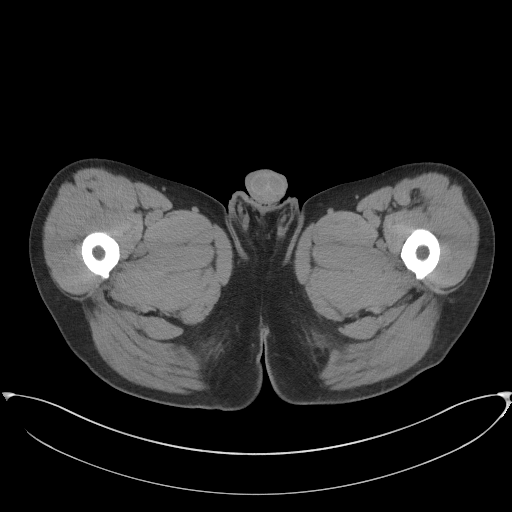
[im 24/114  soft-tissue]
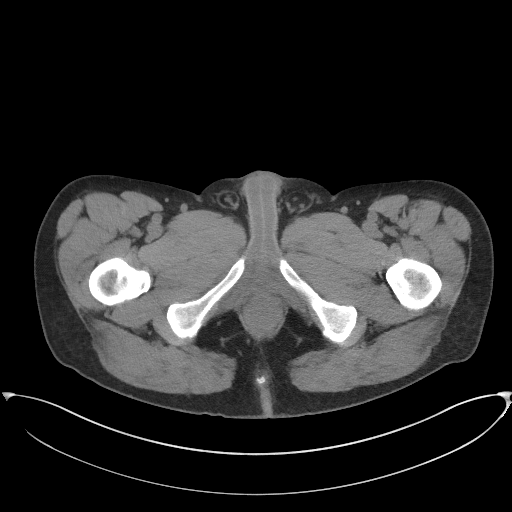
[im 33/114  soft-tissue]
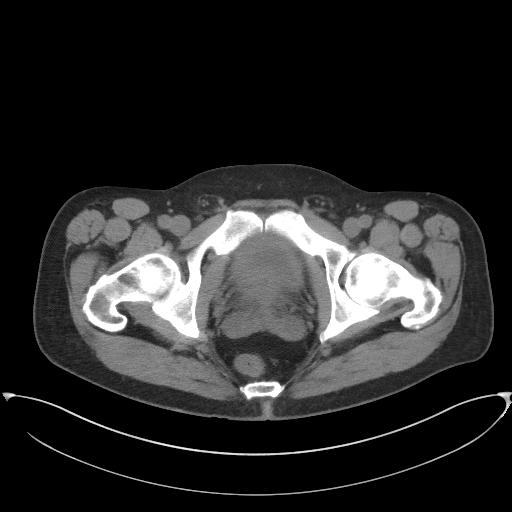
[im 43/114  soft-tissue]
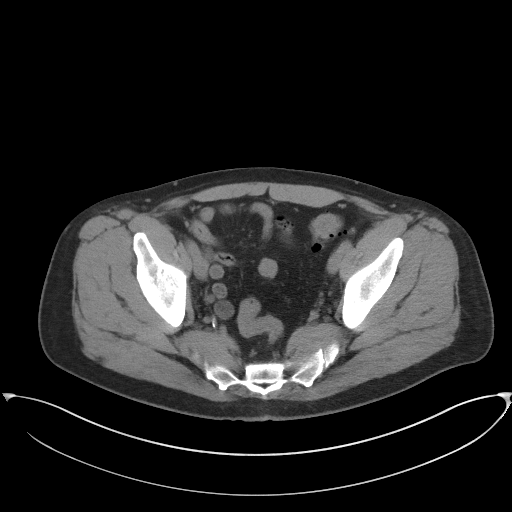
[im 52/114  soft-tissue]
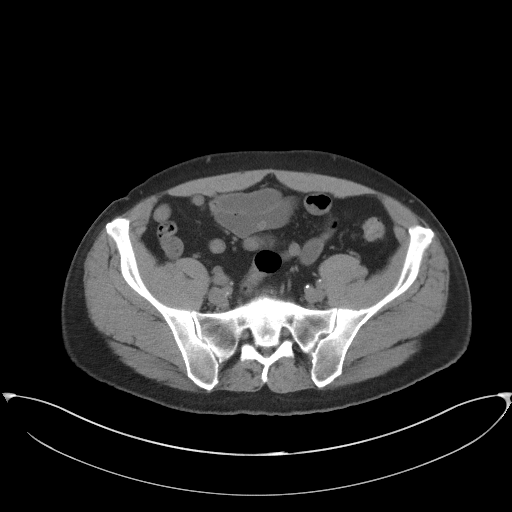
[im 62/114  soft-tissue]
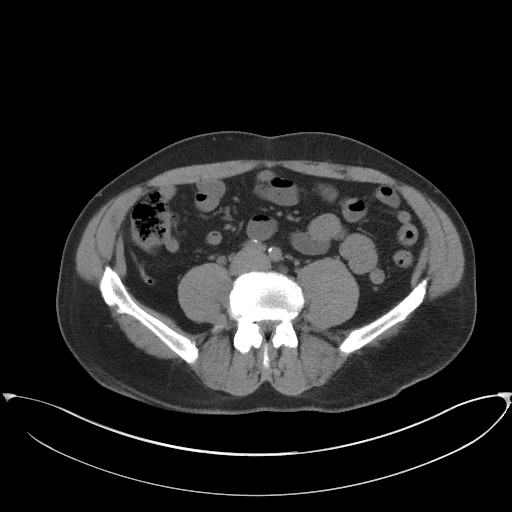
[im 71/114  soft-tissue]
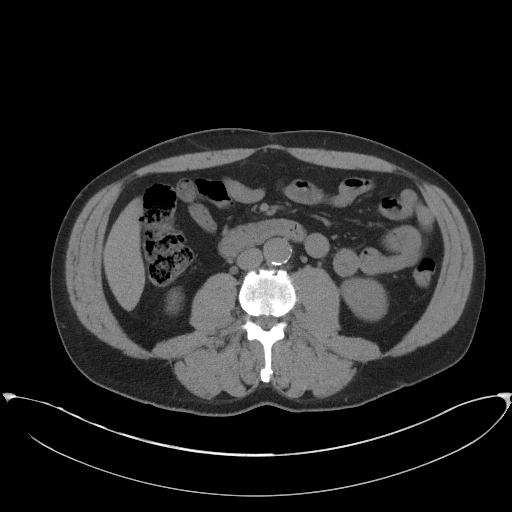
[im 81/114  soft-tissue]
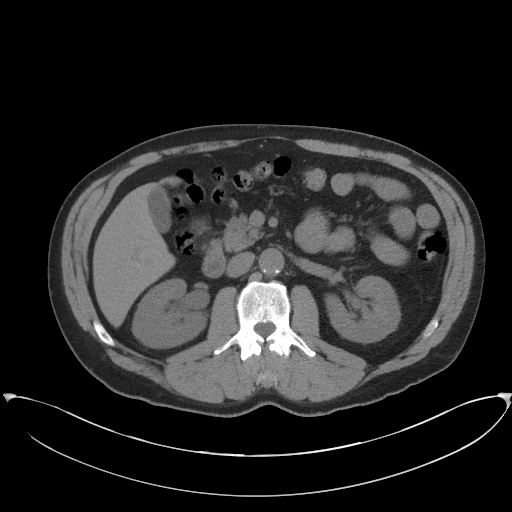
[im 81/114  bone]
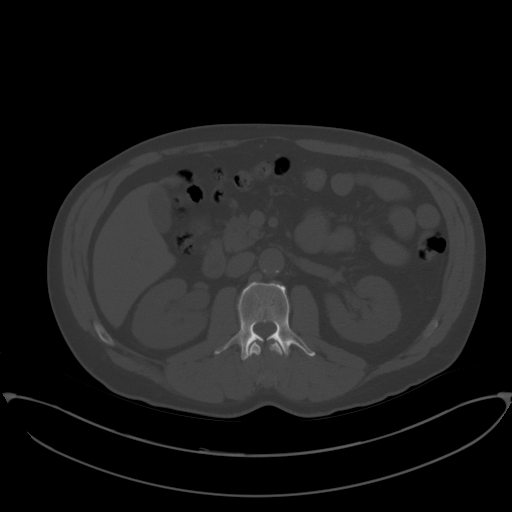
[im 90/114  soft-tissue]
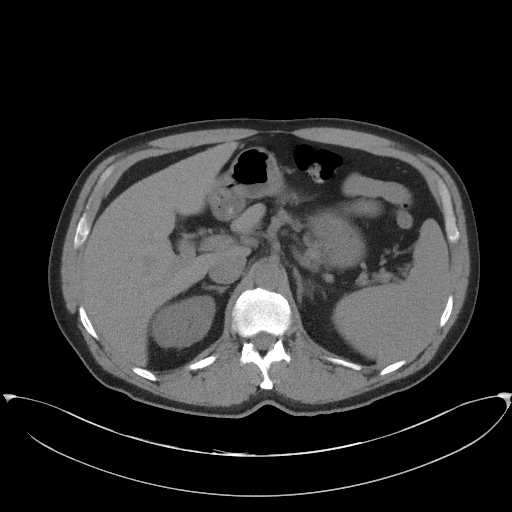
[im 99/114  soft-tissue]
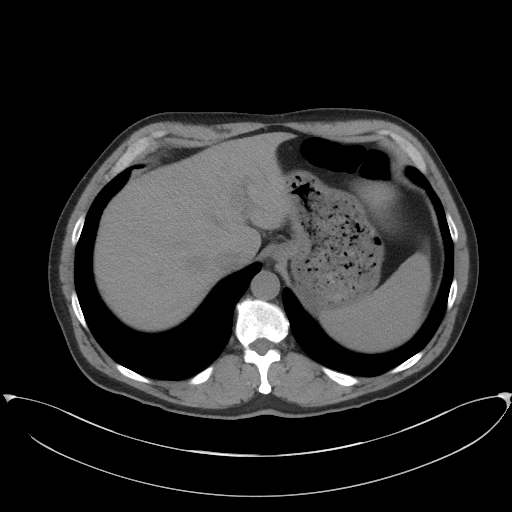
[im 109/114  soft-tissue]
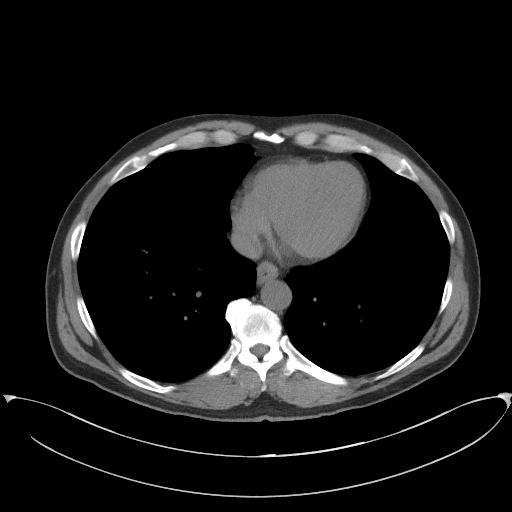

[Series 5: coronal st · coronal · 0.81mm/px · 3 of 94 slices shown]
[im 32/94  soft-tissue]
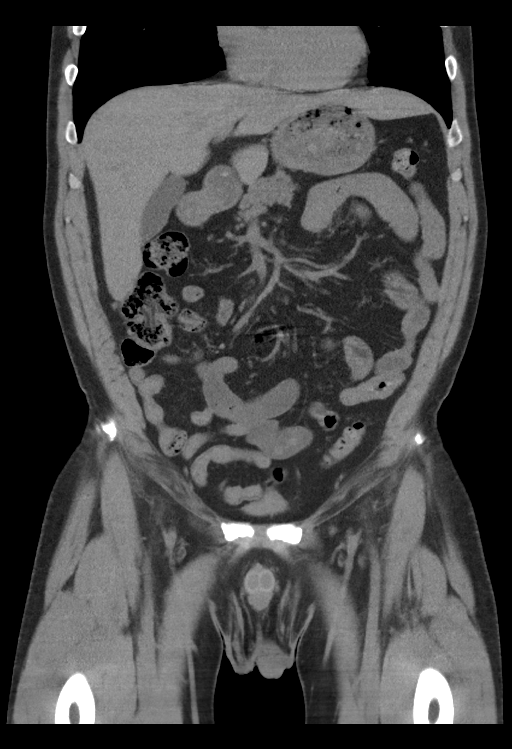
[im 42/94  soft-tissue]
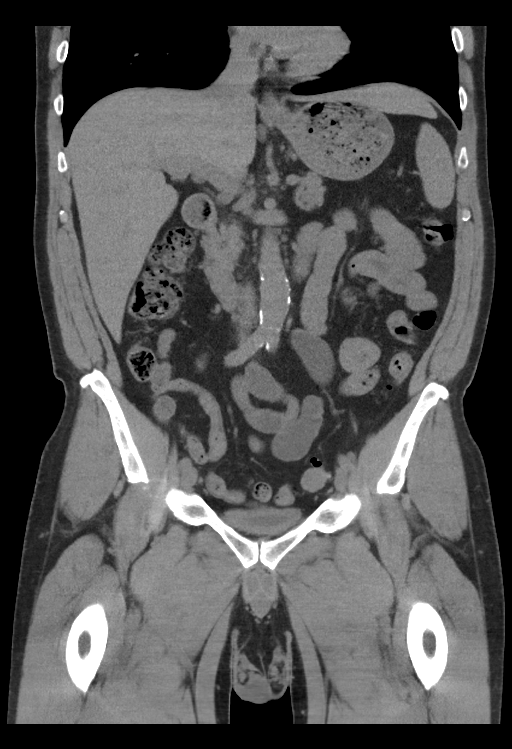
[im 52/94  soft-tissue]
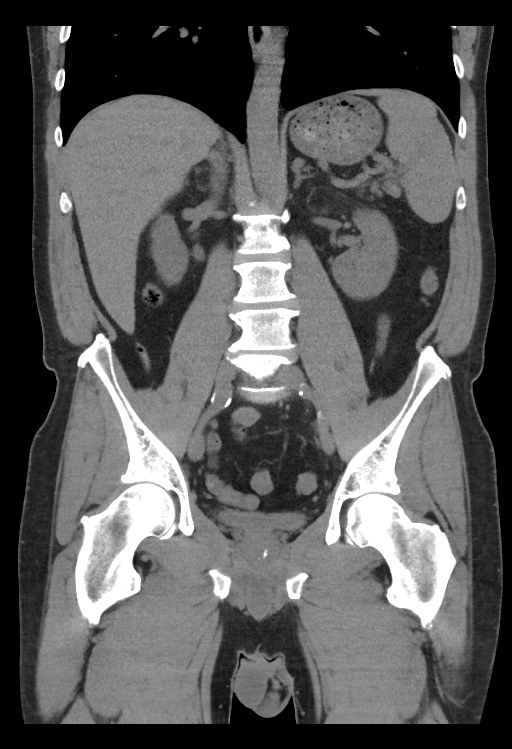

[15 of 46 positions shown; findings below may reference images not displayed]

FINDINGS: LOWER CHEST: No basilar pulmonary nodules or pleural effusion. No
apical pericardial effusion.

HEPATOBILIARY: Normal hepatic contours and density. No intra- or
extrahepatic biliary dilatation. Normal gallbladder.

PANCREAS: Normal parenchymal contours without ductal dilatation. No
peripancreatic fluid collection.

SPLEEN: Normal.

ADRENALS/URINARY TRACT:

--Adrenal glands: Normal.

--Right kidney/ureter: Mild hydroureteronephrosis secondary to a 4
mm stone at the right ureterovesical junction. Minimal perinephric
stranding. There are multiple nonobstructing renal calculi measuring
up to 3 mm.

--Left kidney/ureter: No hydronephrosis, nephroureterolithiasis,
perinephric stranding or solid renal mass.

--Urinary bladder: Normal for degree of distention

STOMACH/BOWEL:

--Stomach/Duodenum: No hiatal hernia or other gastric abnormality.
Normal duodenal course.

--Small bowel: No dilatation or inflammation.

--Colon: No focal abnormality.

--Appendix: Normal.

VASCULAR/LYMPHATIC: Atherosclerotic calcification is present within
the non-aneurysmal abdominal aorta, without hemodynamically
significant stenosis. No abdominal or pelvic lymphadenopathy.

REPRODUCTIVE: Prostate calcification.

MUSCULOSKELETAL. No bony spinal canal stenosis or focal osseous
abnormality.

OTHER: None.
IMPRESSION: 1. Right obstructive uropathy secondary to 4 mm stone at the right
ureterovesical junction, causing mild hydroureteronephrosis.
2. Multiple nonobstructing right renal calculi measuring up to 3 mm.
3.  Aortic Atherosclerosis ([id number]).
